# Patient Record
Sex: Female | Born: 1950 | Race: White | Hispanic: No | Smoking: Never smoker
Health system: Southern US, Community
[De-identification: ages and names within clinical notes are randomized; demographics above are authoritative.]

## PROBLEM LIST (undated history)

## (undated) DIAGNOSIS — M810 Age-related osteoporosis without current pathological fracture: Secondary | ICD-10-CM

## (undated) DIAGNOSIS — J45909 Unspecified asthma, uncomplicated: Secondary | ICD-10-CM

---

## 2016-04-03 ENCOUNTER — Emergency Department (HOSPITAL_COMMUNITY): Payer: Medicare Other

## 2016-04-03 ENCOUNTER — Emergency Department (HOSPITAL_COMMUNITY)
Admission: EM | Admit: 2016-04-03 | Discharge: 2016-04-03 | Disposition: A | Discharge status: Home / Self Care | Payer: Medicare Other | Attending: Emergency Medicine | Admitting: Emergency Medicine

## 2016-04-03 ENCOUNTER — Encounter (HOSPITAL_COMMUNITY): Payer: Self-pay

## 2016-04-03 DIAGNOSIS — J069 Acute upper respiratory infection, unspecified: Secondary | ICD-10-CM

## 2016-04-03 DIAGNOSIS — J45901 Unspecified asthma with (acute) exacerbation: Secondary | ICD-10-CM | POA: Insufficient documentation

## 2016-04-03 DIAGNOSIS — R05 Cough: Secondary | ICD-10-CM | POA: Diagnosis present

## 2016-04-03 DIAGNOSIS — B9789 Other viral agents as the cause of diseases classified elsewhere: Secondary | ICD-10-CM

## 2016-04-03 HISTORY — DX: Unspecified asthma, uncomplicated: J45.909

## 2016-04-03 HISTORY — DX: Age-related osteoporosis without current pathological fracture: M81.0

## 2016-04-03 LAB — BASIC METABOLIC PANEL
ANION GAP: 11 (ref 5–15)
BUN: 16 mg/dL (ref 6–20)
CALCIUM: 9.6 mg/dL (ref 8.9–10.3)
CO2: 24 mmol/L (ref 22–32)
Chloride: 102 mmol/L (ref 101–111)
Creatinine, Ser: 1.12 mg/dL — ABNORMAL HIGH (ref 0.44–1.00)
GFR calc Af Amer: 58 mL/min — ABNORMAL LOW (ref >60)
GFR calc non Af Amer: 50 mL/min — ABNORMAL LOW (ref >60)
Glucose, Bld: 149 mg/dL — ABNORMAL HIGH (ref 65–99)
Potassium: 3.5 mmol/L (ref 3.5–5.1)
Sodium: 137 mmol/L (ref 135–145)

## 2016-04-03 LAB — I-STAT TROPONIN, ED
TROPONIN I, POC: 0.01 ng/mL (ref 0.00–0.08)
TROPONIN I, POC: 0.01 ng/mL (ref 0.00–0.08)

## 2016-04-03 LAB — CBC
HCT: 38.9 % (ref 36.0–46.0)
HEMOGLOBIN: 13.5 g/dL (ref 12.0–15.0)
MCH: 29.5 pg (ref 26.0–34.0)
MCHC: 34.7 g/dL (ref 30.0–36.0)
MCV: 84.9 fL (ref 78.0–100.0)
Platelets: 214 10*3/uL (ref 150–400)
RBC: 4.58 MIL/uL (ref 3.87–5.11)
RDW: 12.3 % (ref 11.5–15.5)
WBC: 7.3 10*3/uL (ref 4.0–10.5)

## 2016-04-03 MED ORDER — ONDANSETRON HCL 4 MG/2ML IJ SOLN
4.0000 mg | Freq: Once | INTRAMUSCULAR | Status: AC
  Administered 2016-04-03: 4 mg via INTRAVENOUS
  Filled 2016-04-03: qty 2

## 2016-04-03 MED ORDER — SODIUM CHLORIDE 0.9 % IV BOLUS (SEPSIS)
1000.0000 mL | Freq: Once | INTRAVENOUS | Status: AC
  Administered 2016-04-03: 1000 mL via INTRAVENOUS

## 2016-04-03 MED ORDER — IPRATROPIUM-ALBUTEROL 0.5-2.5 (3) MG/3ML IN SOLN
3.0000 mL | Freq: Once | RESPIRATORY_TRACT | Status: AC
  Administered 2016-04-03: 3 mL via RESPIRATORY_TRACT
  Filled 2016-04-03: qty 3

## 2016-04-03 MED ORDER — AEROCHAMBER PLUS W/MASK MISC
1.0000 | Freq: Once | Status: AC
  Administered 2016-04-03: 1
  Filled 2016-04-03: qty 1

## 2016-04-03 MED ORDER — ALBUTEROL SULFATE (2.5 MG/3ML) 0.083% IN NEBU: 2.5000 mg | INHALATION_SOLUTION | Freq: Four times a day (QID) | RESPIRATORY_TRACT | 0 refills | Status: AC | PRN

## 2016-04-03 MED ORDER — ONDANSETRON 4 MG PO TBDP: 4.0000 mg | ORAL_TABLET | Freq: Three times a day (TID) | ORAL | 0 refills | Status: AC | PRN

## 2016-04-03 MED ORDER — DEXAMETHASONE SODIUM PHOSPHATE 10 MG/ML IJ SOLN
10.0000 mg | Freq: Once | INTRAMUSCULAR | Status: AC
  Administered 2016-04-03: 10 mg via INTRAVENOUS
  Filled 2016-04-03: qty 1

## 2016-04-03 NOTE — ED Notes (Signed)
Pt given gingerale for PO challenge per Dr. Clarene DukeLittle.

## 2016-04-03 NOTE — ED Notes (Signed)
Pt O2 sats 90% on RA. Pt placed on 2 L [place]. MD aware. Pt reports "I've always been told my oxygen is low."

## 2016-04-03 NOTE — ED Provider Notes (Signed)
[facility]-EMERGENCY DEPT Provider Note   CSN: [id number] Arrival date & time: 04/03/16  1110   By signing my name below, I, Teofilo Pod, attest that this documentation has been prepared under the direction and in the presence of Laurence Spates, MD . Electronically Signed: Teofilo Pod, ED Scribe. 04/03/2016. 4:45 PM.   History   Chief Complaint Chief Complaint  Patient presents with  . Back Pain  . Cough    The history is provided by the patient. No language interpreter was used.   HPI Comments:  Summer Abbott is a 66 y.o. female with PMHx of asthma who presents to the Emergency Department complaining of persistent cough and chest congestion x 4 days. Pt complains of associated associated nausea, vomiting, diarrhea, nasal congestion, back pain, chest pressure, fever of 100. She has had the chest pressure previously with asthma. Pt notes that her symptoms were worse when she woke up today. Pt was seen at urgent care yesterday and given prednisone. She took it yesterday but vomited today's dose.She took albuterol today at 0700;  breathing treatment gives mild, temporary relief. Pt is not a smoker, and she reports sick contact with family members. Pt denies sore throat. No FH of heart disease.  Past Medical History:  Diagnosis Date  . Asthma   . Osteoporosis     There are no active problems to display for this patient.   History reviewed. No pertinent surgical history.  OB History    No data available       Home Medications    Prior to Admission medications   Medication Sig Start Date End Date Taking? Authorizing Provider  albuterol (PROVENTIL) (2.5 MG/3ML) 0.083% nebulizer solution Take 3 mLs (2.5 mg total) by nebulization every 6 (six) hours as needed for wheezing or shortness of breath. 04/03/16   Laurence Spates, MD  ondansetron (ZOFRAN ODT) 4 MG disintegrating tablet Take 1 tablet (4 mg total) by mouth every 8 (eight) hours as needed for nausea or  vomiting. 04/03/16   Laurence Spates, MD    Family History History reviewed. No pertinent family history.  Social History Social History  Substance Use Topics  . Smoking status: Never Smoker  . Smokeless tobacco: Never Used  . Alcohol use No     Allergies   Ceftin [cefuroxime]   Review of Systems Review of Systems 10 Systems reviewed and are negative for acute change except as noted in the HPI.   Physical Exam Updated Vital Signs BP 116/65 (BP Location: Left Arm)   Pulse 85   Temp 99.5 F (37.5 C) (Oral)   Resp 19   SpO2 90%   Physical Exam  Constitutional: She is oriented to person, place, and time. She appears well-developed and well-nourished. No distress.  HENT:  Head: Normocephalic and atraumatic.  Mouth/Throat: Oropharynx is clear and moist.  Moist mucous membranes. Hoarse voice.   Eyes: Conjunctivae are normal. Pupils are equal, round, and reactive to light.  Neck: Neck supple.  Cardiovascular: Normal rate, regular rhythm and normal heart sounds.   No murmur heard. Pulmonary/Chest: Effort normal.  Diminished breath sounds bilaterally with expiratory wheezes in bases.   Abdominal: Soft. Bowel sounds are normal. She exhibits no distension. There is no tenderness.  Musculoskeletal: She exhibits no edema.  Neurological: She is alert and oriented to person, place, and time.  Fluent speech  Skin: Skin is warm and dry.  Psychiatric: She has a normal mood and affect. Judgment normal.  Nursing note  and vitals reviewed.    ED Treatments / Results  DIAGNOSTIC STUDIES:  Oxygen Saturation is 92% on RA, adquate by my interpretation.    COORDINATION OF CARE:  4:45 PM Discussed treatment plan with pt at bedside and pt agreed to plan.   Labs (all labs ordered are listed, but only abnormal results are displayed) Labs Reviewed  BASIC METABOLIC PANEL - Abnormal; Notable for the following:       Result Value   Glucose, Bld 149 (*)    Creatinine, Ser 1.12 (*)     GFR calc non Af Amer 50 (*)    GFR calc Af Amer 58 (*)    All other components within normal limits  CBC  I-STAT TROPOININ, ED  I-STAT TROPOININ, ED    EKG  EKG Interpretation  Date/Time:  Thursday April 03 2016 11:54:53 EST Ventricular Rate:  101 PR Interval:  144 QRS Duration: 76 QT Interval:  328 QTC Calculation: 425 R Axis:   77 Text Interpretation:  Sinus tachycardia Nonspecific ST abnormality Abnormal ECG No previous ECGs available Confirmed by Satoshi Kalas MD, Emiel Kielty [id number]) on 04/03/2016 4:33:49 PM       Radiology Dg Chest 2 View  Result Date: 04/03/2016 CLINICAL DATA:  Chest pain, cough, congestion EXAM: CHEST  2 VIEW COMPARISON:  None. FINDINGS: The lungs are hyperinflated likely secondary to COPD. There is a 16 mm nodular opacity in the right upper lobe. There is no focal consolidation. There is no pleural effusion or pneumothorax. The heart and mediastinal contours are unremarkable. The osseous structures are unremarkable. IMPRESSION: No active cardiopulmonary disease. 16 mm nodular opacity in the right upper lobe. Recommend further evaluation with a nonemergent CT of the chest. Electronically Signed   By: Elige KoHetal  Patel   On: 04/03/2016 12:27    Procedures Procedures (including critical care time)  Medications Ordered in ED Medications  ipratropium-albuterol (DUONEB) 0.5-2.5 (3) MG/3ML nebulizer solution 3 mL (3 mLs Nebulization Given 04/03/16 1712)  sodium chloride 0.9 % bolus 1,000 mL (0 mLs Intravenous Stopped 04/03/16 1818)  ondansetron (ZOFRAN) injection 4 mg (4 mg Intravenous Given 04/03/16 1712)  dexamethasone (DECADRON) injection 10 mg (10 mg Intravenous Given 04/03/16 1712)  aerochamber plus with mask device 1 each (1 each Other Given 04/03/16 2004)     Initial Impression / Assessment and Plan / ED Course  I have reviewed the triage vital signs and the nursing notes.  Pertinent labs & imaging results that were available during my care of the patient were reviewed  by me and considered in my medical decision making (see chart for details).  Clinical Course    Pt w/ several days of URI sx including cough, fever, vomiting, diarrhea, and SOB c/w asthma. No improvement after starting prednisone yesterday. She was nontoxic and in no acute distress at presentation. She was afebrile, vital signs notable for mild hypertension, O2 92-96% on room air. She had diminished breath sounds bilaterally with faint end expiratory wheezes in bases. Gave DuoNeb and Decadron as well as IV fluid bolus and Zofran for nausea.  Labwork shows normal CBC, creatinine 1.12, negative serial troponins. Chest x-ray negative for acute process. On reexamination, the patient was well-appearing and stated that she felt much better. Nurse noted occasional drop in O2 saturation at 90%. The patient states that she has always been told that her O2 saturations run lower and states that this is her normal. Her air movement has improved and wheezes have also improved on reexamination. She has been able  to tolerate ginger ale. Because of her significant improvement with asthma treatment, I doubt that her chest pain represents ACS or other acute cardiac process. Her HEART score is <3 and given current viral illness sx I feel she is safe for discharge. I have asked supportive care including scheduled albuterol and hydration. Provided with spacer to use with inhaler and refill on albuterol nebulizer. Also provided with Zofran to use at needed. Reviewed return precautions. Patient voiced understanding and was discharged in satisfactory condition. Final Clinical Impressions(s) / ED Diagnoses   Final diagnoses:  Viral URI with cough  Moderate asthma with exacerbation, unspecified whether persistent    New Prescriptions Discharge Medication List as of 04/03/2016  7:55 PM    START taking these medications   Details  albuterol (PROVENTIL) (2.5 MG/3ML) 0.083% nebulizer solution Take 3 mLs (2.5 mg total) by  nebulization every 6 (six) hours as needed for wheezing or shortness of breath., Starting Thu 04/03/2016, Print    ondansetron (ZOFRAN ODT) 4 MG disintegrating tablet Take 1 tablet (4 mg total) by mouth every 8 (eight) hours as needed for nausea or vomiting., Starting Thu 04/03/2016, Print      I personally performed the services described in this documentation, which was scribed in my presence. The recorded information has been reviewed and is accurate.     Laurence Spates, MD 04/03/16 2045

## 2016-04-03 NOTE — ED Triage Notes (Signed)
Pt reports cough, chest congestion, and n/v X3 days. She reports she has been feeling sick X3 days. Seen at urgent care for same. Pt reports hx of asthma.

## 2018-04-02 IMAGING — DX DG CHEST 2V
2 series · 2 of 2 positions shown · non-contrast
Comparison: None.

CLINICAL DATA: Chest pain, cough, congestion

EXAM:
CHEST  2 VIEW

[w chest pa]
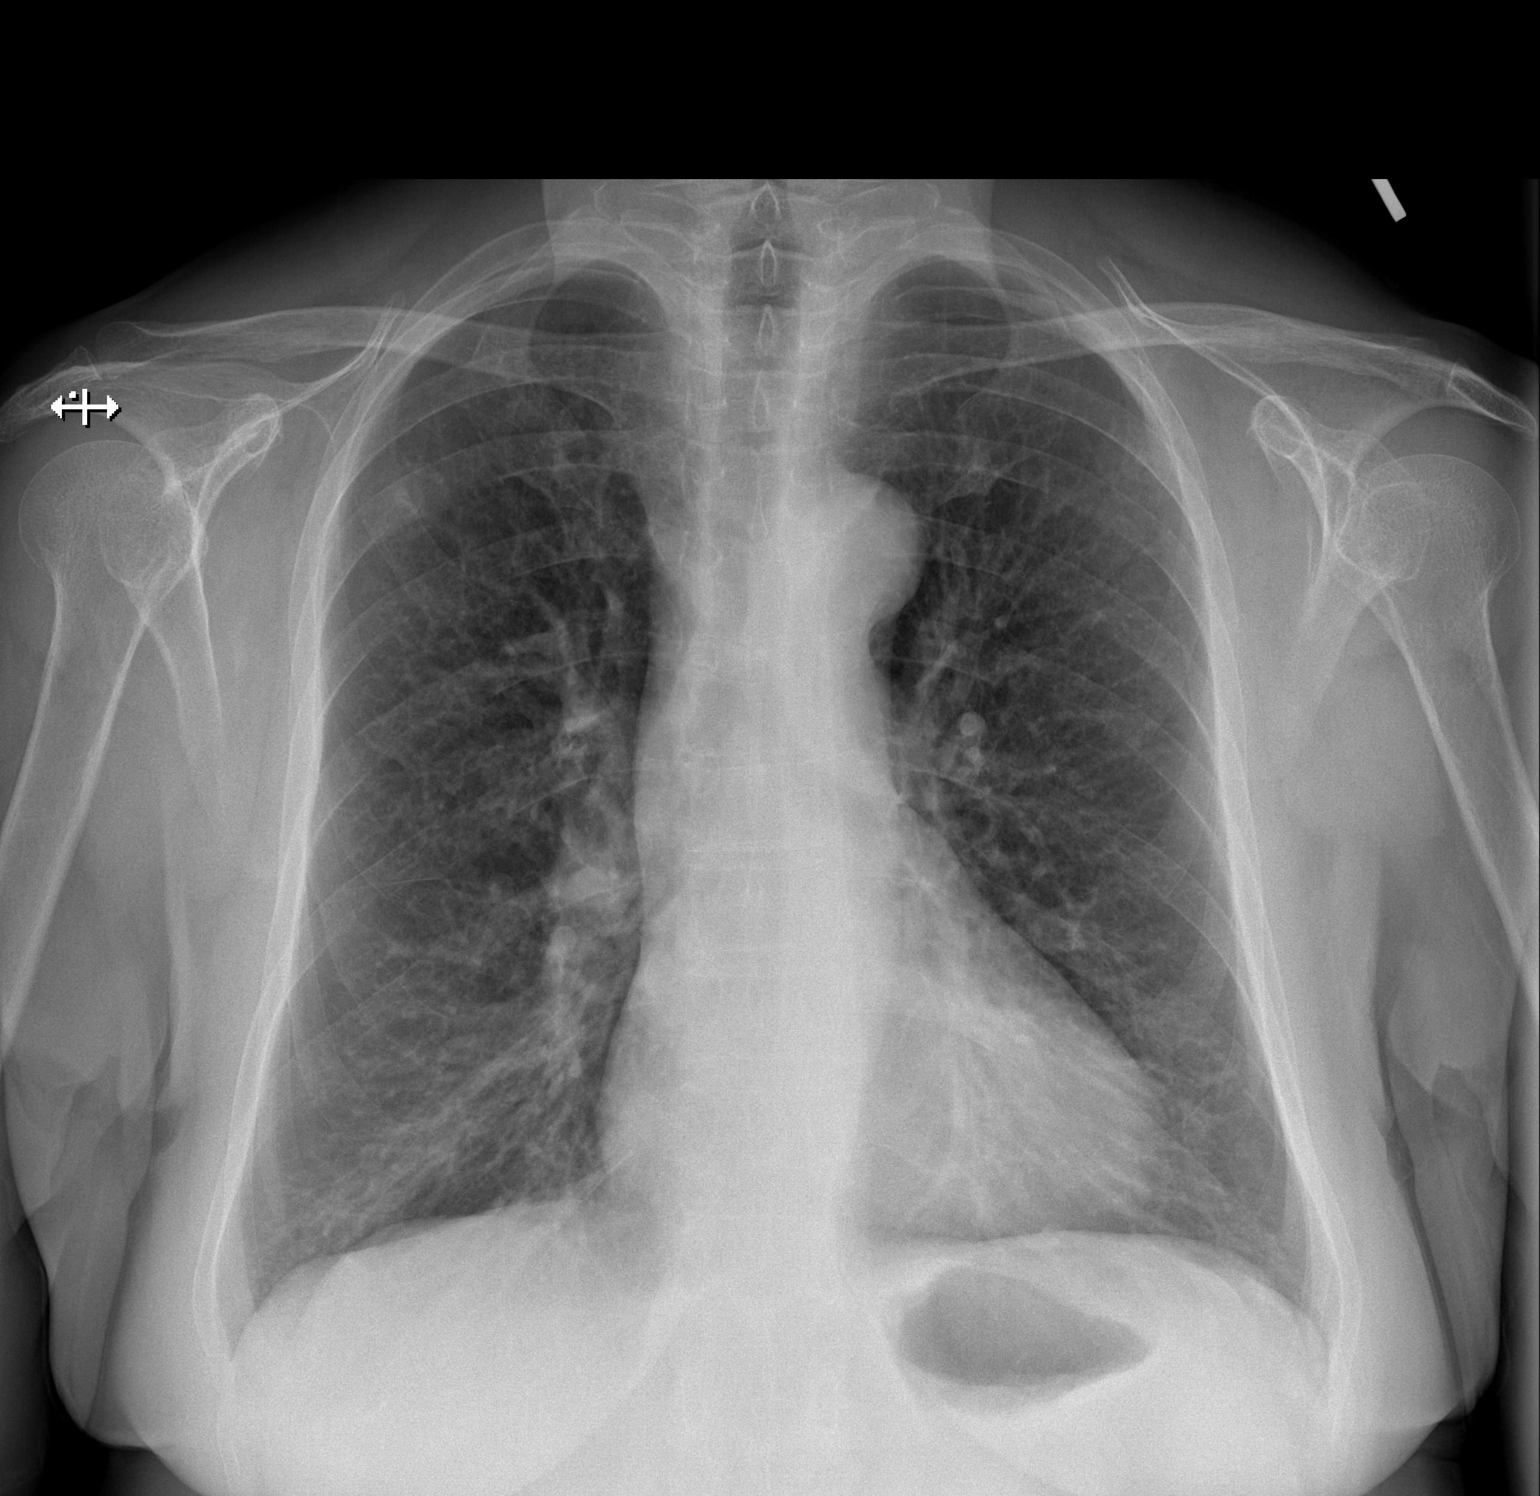

[w chest lat]
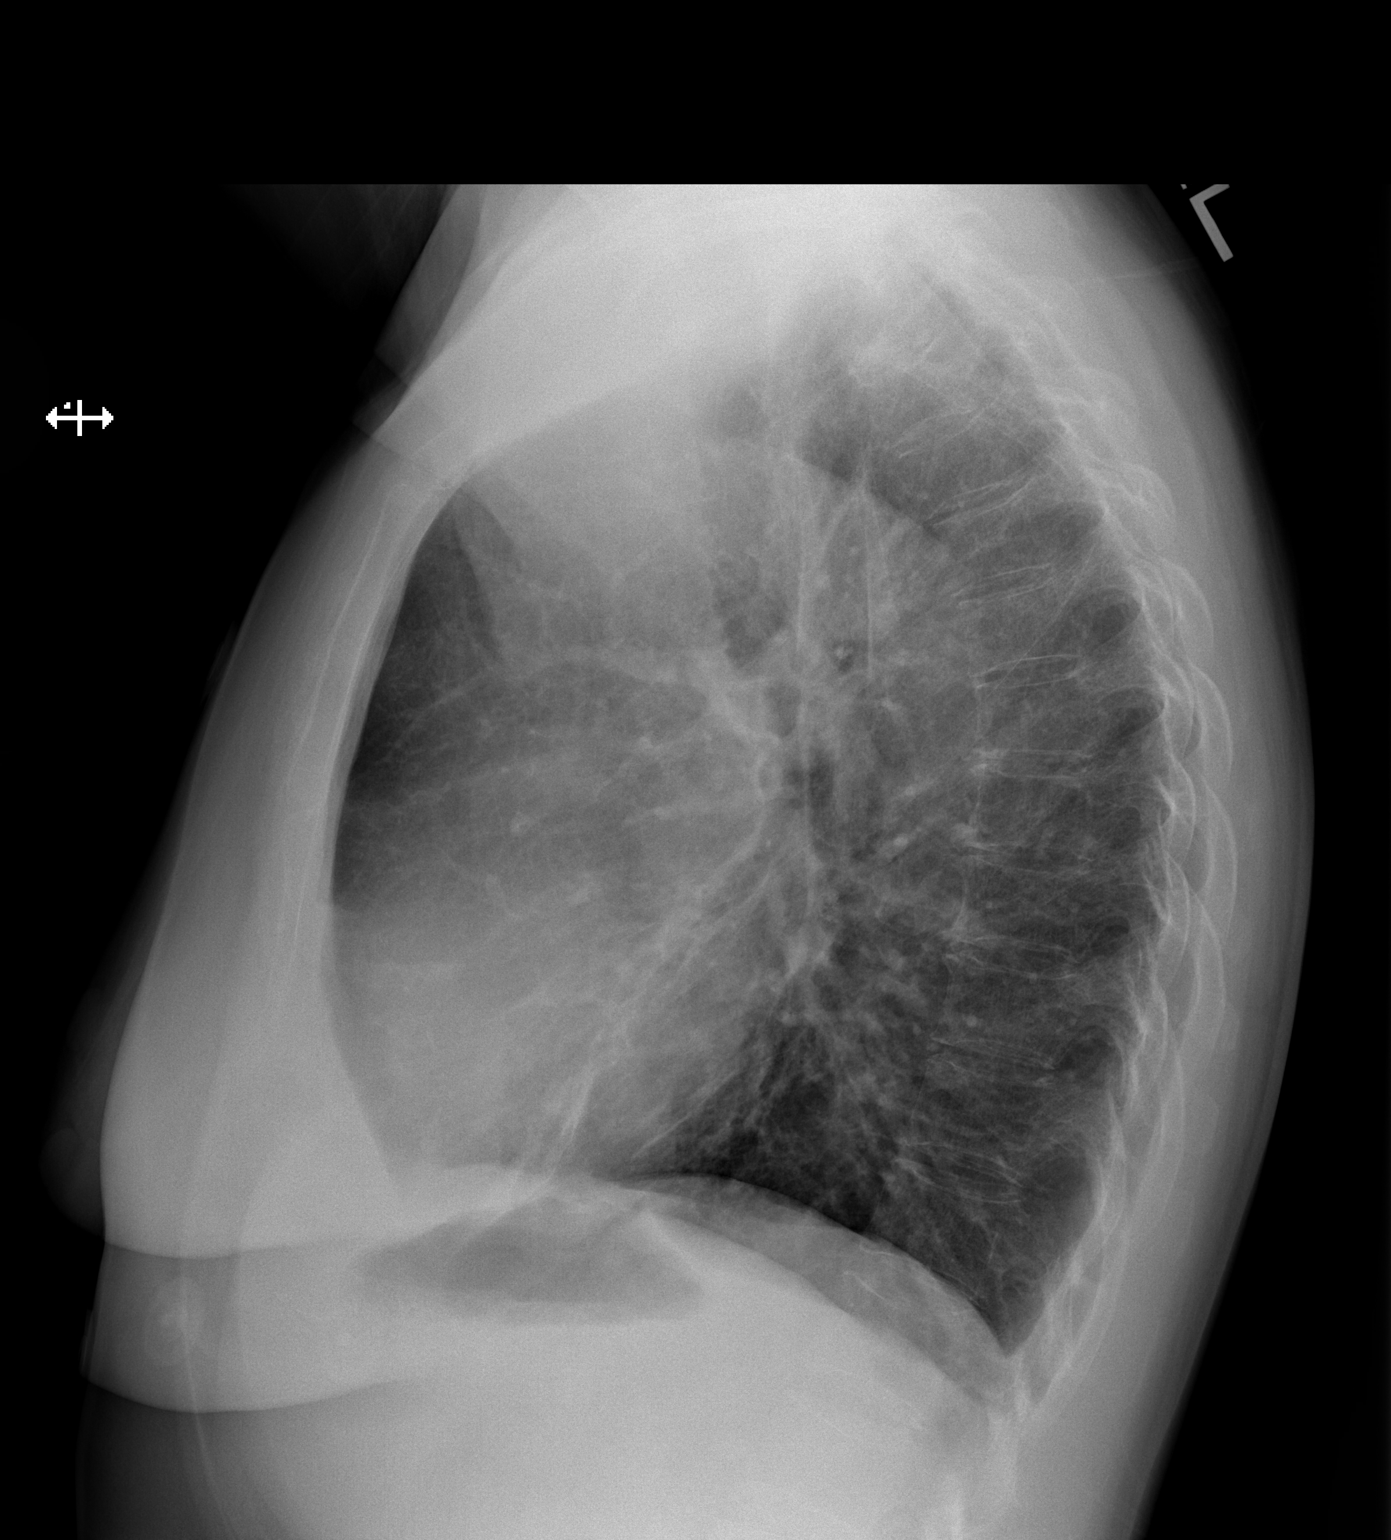

[2 of 2 positions shown; findings below may reference images not displayed]

FINDINGS: The lungs are hyperinflated likely secondary to COPD. There is a 16
mm nodular opacity in the right upper lobe. There is no focal
consolidation. There is no pleural effusion or pneumothorax. The
heart and mediastinal contours are unremarkable.

The osseous structures are unremarkable.
IMPRESSION: No active cardiopulmonary disease.

16 mm nodular opacity in the right upper lobe. Recommend further
evaluation with a nonemergent CT of the chest.
